# Patient Record
Sex: Male | Born: 1955 | Race: White | Hispanic: No | Marital: Married | Smoking: Former smoker
Health system: Southern US, Community
[De-identification: ages and names within clinical notes are randomized; demographics above are authoritative.]

## PROBLEM LIST (undated history)

## (undated) DIAGNOSIS — T753XXA Motion sickness, initial encounter: Secondary | ICD-10-CM

## (undated) DIAGNOSIS — G4730 Sleep apnea, unspecified: Secondary | ICD-10-CM

## (undated) DIAGNOSIS — C61 Malignant neoplasm of prostate: Secondary | ICD-10-CM

## (undated) HISTORY — DX: Malignant neoplasm of prostate: C61

---
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
## 1994-08-03 HISTORY — PX: APPENDECTOMY: SHX54

## 2004-08-29 ENCOUNTER — Ambulatory Visit: Payer: Self-pay | Admitting: Family Medicine

## 2007-08-04 DIAGNOSIS — C61 Malignant neoplasm of prostate: Secondary | ICD-10-CM

## 2007-08-04 HISTORY — DX: Malignant neoplasm of prostate: C61

## 2007-08-04 HISTORY — PX: TRANSURETHRAL RESECTION OF PROSTATE: SHX73

## 2007-08-04 HISTORY — PX: SHOULDER ADHESION RELEASE: SHX773

## 2007-08-04 HISTORY — PX: TOTAL HIP ARTHROPLASTY: SHX124

## 2009-01-29 ENCOUNTER — Ambulatory Visit: Payer: Self-pay | Admitting: Gastroenterology

## 2009-02-15 ENCOUNTER — Ambulatory Visit: Payer: Self-pay | Admitting: Unknown Physician Specialty

## 2009-08-07 ENCOUNTER — Ambulatory Visit: Payer: Self-pay | Admitting: Specialist

## 2011-08-04 LAB — HM COLONOSCOPY: HM Colonoscopy: NORMAL

## 2012-01-26 DIAGNOSIS — M76899 Other specified enthesopathies of unspecified lower limb, excluding foot: Secondary | ICD-10-CM | POA: Insufficient documentation

## 2012-01-26 DIAGNOSIS — Z8546 Personal history of malignant neoplasm of prostate: Secondary | ICD-10-CM | POA: Insufficient documentation

## 2012-01-26 DIAGNOSIS — Z96649 Presence of unspecified artificial hip joint: Secondary | ICD-10-CM | POA: Insufficient documentation

## 2012-01-26 DIAGNOSIS — M25559 Pain in unspecified hip: Secondary | ICD-10-CM | POA: Insufficient documentation

## 2013-09-20 ENCOUNTER — Ambulatory Visit: Payer: Self-pay | Admitting: Anesthesiology

## 2013-09-20 LAB — CBC WITH DIFFERENTIAL/PLATELET
Basophil #: 0 10*3/uL (ref 0.0–0.1)
Basophil %: 0.7 %
EOS PCT: 2.4 %
Eosinophil #: 0.1 10*3/uL (ref 0.0–0.7)
HCT: 44.2 % (ref 40.0–52.0)
HGB: 14.8 g/dL (ref 13.0–18.0)
LYMPHS PCT: 32.7 %
Lymphocyte #: 1.8 10*3/uL (ref 1.0–3.6)
MCH: 26.9 pg (ref 26.0–34.0)
MCHC: 33.4 g/dL (ref 32.0–36.0)
MCV: 81 fL (ref 80–100)
MONOS PCT: 8.1 %
Monocyte #: 0.4 x10 3/mm (ref 0.2–1.0)
NEUTROS PCT: 56.1 %
Neutrophil #: 3.1 10*3/uL (ref 1.4–6.5)
Platelet: 207 10*3/uL (ref 150–440)
RBC: 5.49 10*6/uL (ref 4.40–5.90)
RDW: 13.7 % (ref 11.5–14.5)
WBC: 5.5 10*3/uL (ref 3.8–10.6)

## 2013-09-20 LAB — BASIC METABOLIC PANEL
ANION GAP: 4 — AB (ref 7–16)
BUN: 18 mg/dL (ref 7–18)
CALCIUM: 8.9 mg/dL (ref 8.5–10.1)
CO2: 26 mmol/L (ref 21–32)
CREATININE: 1.18 mg/dL (ref 0.60–1.30)
Chloride: 108 mmol/L — ABNORMAL HIGH (ref 98–107)
EGFR (African American): >60
Glucose: 109 mg/dL — ABNORMAL HIGH (ref 65–99)
Osmolality: 278 (ref 275–301)
Potassium: 3.9 mmol/L (ref 3.5–5.1)
Sodium: 138 mmol/L (ref 136–145)

## 2013-09-25 ENCOUNTER — Ambulatory Visit: Payer: Self-pay | Admitting: Surgery

## 2014-08-09 LAB — LIPID PANEL
Cholesterol: 222 mg/dL — AB (ref 0–200)
HDL: 46 mg/dL (ref 35–70)
LDL CALC: 150 mg/dL
TRIGLYCERIDES: 128 mg/dL (ref 40–160)

## 2014-08-09 LAB — BASIC METABOLIC PANEL
CREATININE: 1.1 mg/dL (ref 0.6–1.3)
Glucose: 98 mg/dL
Potassium: 4.3 mmol/L (ref 3.4–5.3)
Sodium: 139 mmol/L (ref 137–147)

## 2014-08-09 LAB — HEPATIC FUNCTION PANEL
ALT: 19 U/L (ref 10–40)
AST: 20 U/L (ref 14–40)

## 2014-08-09 LAB — PSA: PSA: <0.1

## 2014-08-09 LAB — HEMOGLOBIN A1C: HEMOGLOBIN A1C: 5.3 % (ref 4.0–6.0)

## 2014-10-18 ENCOUNTER — Ambulatory Visit: Payer: Self-pay | Admitting: Gastroenterology

## 2014-10-30 ENCOUNTER — Ambulatory Visit: Payer: Self-pay | Admitting: Specialist

## 2014-11-24 NOTE — Op Note (Signed)
PATIENT NAME:  Brendan Day, ANTOLIN MR#:  [id number] DATE OF BIRTH:  04-15-1956  DATE OF PROCEDURE:  09/25/2013  PREOPERATIVE DIAGNOSIS: Symptomatic ventral incisional hernia.   POSTOPERATIVE DIAGNOSIS:  Symptomatic ventral incisional hernia.   PROCEDURE PERFORMED: Robotically assisted laparoscopic ventral herniorrhaphy with mesh and 11 cm oval Bard.     SURGEON: Sherri Rad, M.D.   ASSISTANT: None.   ANESTHESIA: General endotracheal and local.   FINDINGS: There was a 2 x 4 cm fascial defect with small fascial bridge involving the area of the previous laparoscopic trocar site above the umbilicus. It was incarcerated with omentum.   SPECIMENS: None.   ESTIMATED BLOOD LOSS: Minimal.   DESCRIPTION OF PROCEDURE: With informed consent, supine position, general endotracheal anesthesia, Foley catheter was placed under sterile technique, left arm was padded and tucked at his side. The abdomen was widely clipped of hair, prepped and draped with ChloraPrep solution and timeout was observed. A 10 mm Visiport was placed under direct visualization through transversely oriented lateral incision on the left side, 15 cm lateral to the ventral hernia site. This was performed under direct visualization and pneumoperitoneum was established. The abdomen was insufflated to a total of 15 mmHg with CO2 insufflation. Two 8.5 mm da Vinci trocars were then placed in the left lateral extreme abdomen. A 10 mm bladeless trocar was then placed between the camera port in the inferior 8.5 mm port site laterally. The patient was then placed right side down, left side up on the table. The robotic arms was then docked. Instruments were then placed under direct visualization. I then moved to the console.   Omental adhesions to the anterior abdominal wall were taken down with sharp dissection and point cautery. The hernia sac was able to be reduced. Incarcerated preperitoneal fat was then excised. The hernia sac was transected at the  fascial interface with sharp dissection and point cautery. Hernia sac contents were removed and discarded.  The intracorporeal ruler was then placed and the fascial defect measured approximately 2 x 4 cm in greatest dimension and there was an additional more inferiorly located fascial defect with small fascial bridge which was measured to include total measurement.   An 11 cm oval diameter Bard mesh with echo balloon system was introduced via the assistant port. It was then centered on the fascial defect. Prior to placement of the mesh. The fascial defect was closed in a transverse orientation with a number 000 V-lock suture with intraperitoneal pressure reduced to 10 mmHg.  With adequate closure, the mesh was then centered with the balloon mechanism on the fascial closure and defect. The mesh was then held in place with the echo balloon. Total of 3 #0 V-Loc sutures were used to circumferentially whipstitch the mesh in place. At the completion of this, the last V-loc suture was rather short and this was reinforced with a single #0 Ethibond suture.   Inspection of the hernia site repaired appeared to be in good order with no evidence of bleeding. There was no gap between the mesh and the anterior abdominal wall. General laparoscopic evaluation with the robotic instruments removed, demonstrated no evidence of further bleeding or omental injury or bowel injury.   Ports were then removed. Pneumoperitoneum was released. A total of 30 mL of 0.25% plain Marcaine was infiltrated along all skin and fascial incisions prior to closure. Skin edges were reapproximated utilizing interrupted simple and vertical mattress 4-0 nylon sutures. Sterile dressings were applied. The patient's Foley catheter was then removed. The patient  was then subsequently extubated and taken to the recovery room in stable and satisfactory condition by anesthesia services.     ____________________________ Jeannette How Marina Gravel, MD  FACS mab:dp D: 09/26/2013 09:17:42 ET T: 09/26/2013 10:52:00 ET JOB#: [id number]  cc: Elta Guadeloupe A. Marina Gravel, MD, <Dictator> Guadalupe Maple, MD Rilan Eiland Bettina Gavia MD ELECTRONICALLY SIGNED 10/04/2013 10:11

## 2015-03-21 ENCOUNTER — Ambulatory Visit (INDEPENDENT_AMBULATORY_CARE_PROVIDER_SITE_OTHER): Payer: BLUE CROSS/BLUE SHIELD | Admitting: Family Medicine

## 2015-03-21 ENCOUNTER — Encounter: Payer: Self-pay | Admitting: Family Medicine

## 2015-03-21 VITALS — BP 113/73 | HR 71 | Temp 98.0°F | Resp 16 | Ht 68.0 in | Wt 209.0 lb

## 2015-03-21 DIAGNOSIS — S39012A Strain of muscle, fascia and tendon of lower back, initial encounter: Secondary | ICD-10-CM

## 2015-03-21 DIAGNOSIS — S338XXA Sprain of other parts of lumbar spine and pelvis, initial encounter: Secondary | ICD-10-CM | POA: Diagnosis not present

## 2015-03-21 LAB — POCT URINALYSIS DIPSTICK
Bilirubin, UA: NEGATIVE
Blood, UA: NEGATIVE
Glucose, UA: NEGATIVE
Ketones, UA: NEGATIVE
LEUKOCYTES UA: NEGATIVE
Nitrite, UA: NEGATIVE
PH UA: 6
PROTEIN UA: NEGATIVE
SPEC GRAV UA: 1.01
Urobilinogen, UA: 0.2

## 2015-03-21 MED ORDER — BACLOFEN 10 MG PO TABS: 10.0000 mg | ORAL_TABLET | Freq: Three times a day (TID) | ORAL | Status: DC

## 2015-03-21 MED ORDER — NAPROXEN 500 MG PO TABS: 500.0000 mg | ORAL_TABLET | Freq: Two times a day (BID) | ORAL | Status: DC

## 2015-03-21 NOTE — Patient Instructions (Signed)
Maintaining an ideal body habitus, regular exercise, proper lifting techniques and mindfulness of exacerbating factors will be useful in long term management.  Instructed on use of heating pad with exercises. Consider concomitant therapy with PT, massage therapist or chiropractor. May use anti-inflammatory medication and muscle relaxer as needed.  If you develop progressive numbness, weakness, loss of feeling in your pelvis, or loss of bowel/bladder control please seek immediate medical attention.

## 2015-03-21 NOTE — Progress Notes (Signed)
Subjective:    Patient ID: Brendan Day, male    DOB: 07/03/1956, 59 y.o.   MRN: [id number]  HPI: Brendan Day is a 59 y.o. male presenting on 03/21/2015 for Back Pain   Pt presents for back pain. 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 Pain started in the evening. He has been off since Sunday- tried ice, heat, rest.    Back Pain This is a new problem. The current episode started in the past 7 days. The pain is present in the lumbar spine. The pain does not radiate. The pain is at a severity of 5/10. The pain is moderate. The pain is worse during the day. The symptoms are aggravated by sitting. Pertinent negatives include no bladder incontinence, bowel incontinence, chest pain, fever, numbness, paresthesias, pelvic pain, perianal numbness, tingling or weakness. Risk factors include history of cancer. He has tried ice, NSAIDs, heat and bed rest for the symptoms. The treatment provided mild relief.      Past Medical History  Diagnosis Date  . Prostate cancer 2009   Past Surgical History  Procedure Laterality Date  . Transurethral resection of prostate  2009  . Total hip arthroplasty  2009  . Tonsillectomy  1978  . Shoulder adhesion release  2009  . Appendectomy  1996   Social History   Social History  . Marital Status: Married    Spouse Name: N/A  . Number of Children: N/A  . Years of Education: N/A   Occupational History  . Not on file.   Social History Main Topics  . Smoking status: Former Research scientist (life sciences)  . Smokeless tobacco: Never Used  . Alcohol Use: No  . Drug Use: No  . Sexual Activity: Not on file   Other Topics Concern  . Not on file   Social History Narrative  . No narrative on file    No current outpatient prescriptions on file prior to visit.   No current facility-administered medications on file prior to visit.    Review of Systems  Constitutional: Negative for fever and chills.  Respiratory:  Negative for chest tightness, shortness of breath and wheezing.   Cardiovascular: Negative for chest pain, palpitations and leg swelling.  Gastrointestinal: Negative for bowel incontinence.  Genitourinary: Negative for bladder incontinence, urgency, flank pain and pelvic pain.  Musculoskeletal: Positive for back pain. Negative for gait problem, neck pain and neck stiffness.  Neurological: Negative for dizziness, tingling, weakness, numbness and paresthesias.   Per HPI unless specifically indicated above     Objective:    BP 113/73 mmHg  Pulse 71  Temp(Src) 98 F (36.7 C) (Oral)  Resp 16  Ht 5\' 8"  (1.727 m)  Wt 209 lb (94.802 kg)  BMI 31.79 kg/m2  Wt Readings from Last 3 Encounters:  03/21/15 209 lb (94.802 kg)    Physical Exam  Constitutional: He is oriented to person, place, and time. He appears well-developed and well-nourished. No distress.  Cardiovascular: Normal rate and regular rhythm.  Exam reveals no gallop and no friction rub.   No murmur heard. Pulmonary/Chest: Effort normal and breath sounds normal. He has no wheezes. He exhibits no tenderness.  Musculoskeletal:       Lumbar back: He exhibits decreased range of motion (due to pain: Unable to twist fully to the L) and tenderness (focal tenderness L lower lumbar spine. No radiation. ).  Negative straight leg test.   Neurological: He is alert and oriented  to person, place, and time. He has normal strength and normal reflexes. No cranial nerve deficit or sensory deficit.  Skin: He is not diaphoretic.   Results for orders placed or performed in visit on 81/44/81  Basic metabolic panel  Result Value Ref Range   Glucose 98 mg/dL   Creatinine 1.1 0.6 - 1.3 mg/dL   Potassium 4.3 3.4 - 5.3 mmol/L   Sodium 139 137 - 147 mmol/L  Lipid panel  Result Value Ref Range   Triglycerides 128 40 - 160 mg/dL   Cholesterol 222 (A) 0 - 200 mg/dL   HDL 46 35 - 70 mg/dL   LDL Cholesterol 150 mg/dL  Hepatic function panel  Result  Value Ref Range   ALT 19 10 - 40 U/L   AST 20 14 - 40 U/L  Hemoglobin A1c  Result Value Ref Range   Hgb A1c MFr Bld 5.3 4.0 - 6.0 %  PSA  Result Value Ref Range   PSA <0.1   POCT Urinalysis Dipstick  Result Value Ref Range   Color, UA yellow    Clarity, UA clear    Glucose, UA neg    Bilirubin, UA neg    Ketones, UA neg    Spec Grav, UA 1.010    Blood, UA neg    pH, UA 6.0    Protein, UA neg    Urobilinogen, UA 0.2    Nitrite, UA neg    Leukocytes, UA Negative Negative  HM COLONOSCOPY  Result Value Ref Range   HM Colonoscopy normal       Assessment & Plan:   Problem List Items Addressed This Visit    None    Visit Diagnoses    Lumbosacral strain, initial encounter    -  Primary    Lumbosacral strain. Urine negative.  NSAIDs and muscle relaxant PRN. Supportive care at home. Back pain alarm symptoms reviewed.     Relevant Medications    baclofen (LIORESAL) 10 MG tablet    naproxen (NAPROSYN) 500 MG tablet    Other Relevant Orders    POCT Urinalysis Dipstick (Completed)       Meds ordered this encounter  Medications  . sildenafil (REVATIO) 20 MG tablet    Sig: Take 20 mg by mouth as needed.  . Omega-3 Fatty Acids (FISH OIL) 1000 MG CAPS    Sig: Take 1,000 mg by mouth daily.  . Glucosamine 750 MG TABS    Sig: Take 1 tablet by mouth daily.  . baclofen (LIORESAL) 10 MG tablet    Sig: Take 1 tablet (10 mg total) by mouth 3 (three) times daily.    Dispense:  30 each    Refill:  0    Order Specific Question:  Supervising Provider    Answer:  Arlis Porta [phone]  . naproxen (NAPROSYN) 500 MG tablet    Sig: Take 1 tablet (500 mg total) by mouth 2 (two) times daily with a meal.    Dispense:  30 tablet    Refill:  0    Order Specific Question:  Supervising Provider    Answer:  Arlis Porta [[id number]]      Follow up plan: Return if symptoms worsen or fail to improve.

## 2016-08-10 ENCOUNTER — Encounter: Payer: Self-pay | Admitting: Family Medicine

## 2016-08-10 ENCOUNTER — Ambulatory Visit (INDEPENDENT_AMBULATORY_CARE_PROVIDER_SITE_OTHER): Payer: BLUE CROSS/BLUE SHIELD | Admitting: Family Medicine

## 2016-08-10 ENCOUNTER — Encounter: Payer: Self-pay | Admitting: *Deleted

## 2016-08-10 VITALS — BP 119/78 | HR 78 | Temp 98.4°F | Wt 209.0 lb

## 2016-08-10 DIAGNOSIS — M62830 Muscle spasm of back: Secondary | ICD-10-CM | POA: Diagnosis not present

## 2016-08-10 DIAGNOSIS — R05 Cough: Secondary | ICD-10-CM

## 2016-08-10 DIAGNOSIS — M545 Low back pain, unspecified: Secondary | ICD-10-CM

## 2016-08-10 DIAGNOSIS — R059 Cough, unspecified: Secondary | ICD-10-CM

## 2016-08-10 MED ORDER — NAPROXEN 500 MG PO TABS: 500.0000 mg | ORAL_TABLET | Freq: Two times a day (BID) | ORAL | 1 refills | Status: AC

## 2016-08-10 MED ORDER — BACLOFEN 10 MG PO TABS: 5.0000 mg | ORAL_TABLET | Freq: Three times a day (TID) | ORAL | 1 refills | Status: DC | PRN

## 2016-08-10 MED ORDER — BENZONATATE 100 MG PO CAPS: 100.0000 mg | ORAL_CAPSULE | Freq: Three times a day (TID) | ORAL | 1 refills | Status: AC | PRN

## 2016-08-10 NOTE — Progress Notes (Signed)
Subjective:    Patient ID: Brendan Day, male    DOB: [birth year], 61 y.o.   MRN: [id number]  Brendan Day is a 61 y.o. male presenting on 08/10/2016 for Cough (for 3 days and now has pulled muscle in back from coughing)  Patient presents for a same day appointment.  HPI  LOW BACK PAIN, Acute Back Spasm (without chronic back pain) - Reports symptoms started last night while coughing and had an acute inciting injury with lower midline back pain. Some improvement today after treatment. Describes pain as deeper knot severe 8-9/10 intermittently when worsening, otherwise he did not have any significant pain at rest if not moving, but worse with prolonged sitting or standing still will gradually get worsening pain. No pain radiating to legs. Missed work today due to pain. - Tried Tramadol (old rx previously rx for prior back pain or hip surgery, thinks it is expired) 50mg  x 1 with good improvement, Ibuprofen 800mg  x1 last night and this AM, no Tylenol - Tried heating pad and back support with relief - No prior history of known lumbar OA/DJD, no prior back surgery - Prior similar back pain flares improved with medications and rest - Admits improved coughing for past 3 days, had recent sick contact with similar symptoms, also with had initially hot / cold flashes (had resolved) - Denies any fevers/chills, numbness, tingling, weakness, loss of control bladder/bowel incontinence or retention, unintentional wt loss, night sweats   Social History  Substance Use Topics  . Smoking status: Former Research scientist (life sciences)  . Smokeless tobacco: Never Used  . Alcohol use No    Review of Systems Per HPI unless specifically indicated above     Objective:    BP 119/78 (BP Location: Left Arm, Patient Position: Sitting, Cuff Size: Large)   Pulse 78   Temp 98.4 F (36.9 C) (Oral)   Wt 209 lb (94.8 kg)   SpO2 99%   BMI 31.78 kg/m   Wt Readings from Last 3 Encounters:  08/10/16 209 lb (94.8 kg)  03/21/15 209 lb  (94.8 kg)    Physical Exam  Constitutional: He appears well-developed and well-nourished. No distress.  Well-appearing, slightly uncomfortable with low back stiffness, cooperative  HENT:  Mouth/Throat: Oropharynx is clear and moist.  Cardiovascular: Normal rate and intact distal pulses.   Pulmonary/Chest: Effort normal.  Musculoskeletal: He exhibits no edema.  Low Back Inspection: Normal appearance, no spinal deformity, symmetrical. Palpation: No tenderness over spinous processes. Bilateral lower lumbar paraspinal muscles mild tender to touch with hypertonicity and spasm ROM: Improved forward flexion active ROM and back extension without discomfort Special Testing: Seated SLR negative for radicular pain bilaterally Strength: Bilateral hip flex/ext 5/5, knee flex/ext 5/5, ankle dorsiflex/plantarflex 5/5 Neurovascular: intact distal sensation to light touch  Neurological: He is alert.  Skin: Skin is warm and dry. No rash noted. He is not diaphoretic. No erythema.  Psychiatric: His behavior is normal.  Nursing note and vitals reviewed.    I have personally reviewed the following lab results from 03/2015.  Results for orders placed or performed in visit on [id number]  Basic metabolic panel  Result Value Ref Range   Glucose 98 mg/dL   Creatinine 1.1 0.6 - 1.3 mg/dL   Potassium 4.3 3.4 - 5.3 mmol/L   Sodium 139 137 - 147 mmol/L  Lipid panel  Result Value Ref Range   Triglycerides 128 40 - 160 mg/dL   Cholesterol 222 (A) 0 - 200 mg/dL   HDL 46 35 - 70 mg/dL  LDL Cholesterol 150 mg/dL  Hepatic function panel  Result Value Ref Range   ALT 19 10 - 40 U/L   AST 20 14 - 40 U/L  Hemoglobin A1c  Result Value Ref Range   Hgb A1c MFr Bld 5.3 4.0 - 6.0 %  PSA  Result Value Ref Range   PSA <0.1   POCT Urinalysis Dipstick  Result Value Ref Range   Color, UA yellow    Clarity, UA clear    Glucose, UA neg    Bilirubin, UA neg    Ketones, UA neg    Spec Grav, UA 1.010    Blood, UA neg     pH, UA 6.0    Protein, UA neg    Urobilinogen, UA 0.2    Nitrite, UA neg    Leukocytes, UA Negative Negative  HM COLONOSCOPY  Result Value Ref Range   HM Colonoscopy normal       Assessment & Plan:   Problem List Items Addressed This Visit    Acute midline low back pain without sciatica - Primary    Acute on chronic (only infrequent prior history intermittent back flares) bilateral LBP without associated sciatica. Suspect likely due to muscle spasm/strain, with suspected injury with persistent coughing, violent coughing spell, no trauma. No known DJD or prior back problem. - No red flag symptoms. Negative SLR for radiculopathy - Improving with conservative therapy  Plan: 1. Start anti-inflammatory trial with rx Naprosyn 500mg  BID wc x 2-4 weeks, then PRN - note discussion with patient about not indication for Tramadol for his current symptoms, he can use old rx but do not plan to treat back spasm with tramadol / opiates, reviewed risks of these medications 2. Start muscle relaxant with Baclofen 10mg  tabs - take 5-10mg  up to TID PRN, titrate up as tolerated 3. May use Tylenol PRN for breakthrough 4. Encouraged continued use of heating pad 1-2x daily for now then PRN 5.  Follow-up 4-6 weeks PRN if not improved for re-evaluation. If not improved consider X-ray imaging if >6 weeks. Consider trial of PT for strengthening. Also can consider prednisone burst if significant worsening or sciatica symptoms.       Relevant Medications   baclofen (LIORESAL) 10 MG tablet   naproxen (NAPROSYN) 500 MG tablet    Other Visit Diagnoses    Muscle spasm of back    - See above A&P LBP    Relevant Medications   baclofen (LIORESAL) 10 MG tablet   naproxen (NAPROSYN) 500 MG tablet   Cough     - Mostly resolved, but provide rx Benzonatate, he has been on this before with improvement, goal to avoid cough and reinjury back   Relevant Medications   benzonatate (TESSALON) 100 MG capsule      Meds  ordered this encounter  Medications  . benzonatate (TESSALON) 100 MG capsule    Sig: Take 1 capsule (100 mg total) by mouth 3 (three) times daily as needed for cough.    Dispense:  30 capsule    Refill:  1  . baclofen (LIORESAL) 10 MG tablet    Sig: Take 0.5-1 tablets (5-10 mg total) by mouth 3 (three) times daily as needed for muscle spasms.    Dispense:  30 each    Refill:  1  . naproxen (NAPROSYN) 500 MG tablet    Sig: Take 1 tablet (500 mg total) by mouth 2 (two) times daily with a meal. For 2 weeks, then as needed  Dispense:  60 tablet    Refill:  1      Follow up plan: Return in about 4 weeks (around 09/07/2016) for low back pain.  Brendan Day, [facility] 08/10/2016, 11:19 PM

## 2016-08-10 NOTE — Assessment & Plan Note (Signed)
Acute on chronic (only infrequent prior history intermittent back flares) bilateral LBP without associated sciatica. Suspect likely due to muscle spasm/strain, with suspected injury with persistent coughing, violent coughing spell, no trauma. No known DJD or prior back problem. - No red flag symptoms. Negative SLR for radiculopathy - Improving with conservative therapy  Plan: 1. Start anti-inflammatory trial with rx Naprosyn 500mg  BID wc x 2-4 weeks, then PRN - note discussion with patient about not indication for Tramadol for his current symptoms, he can use old rx but do not plan to treat back spasm with tramadol / opiates, reviewed risks of these medications 2. Start muscle relaxant with Baclofen 10mg  tabs - take 5-10mg  up to TID PRN, titrate up as tolerated 3. May use Tylenol PRN for breakthrough 4. Encouraged continued use of heating pad 1-2x daily for now then PRN 5.  Follow-up 4-6 weeks PRN if not improved for re-evaluation. If not improved consider X-ray imaging if >6 weeks. Consider trial of PT for strengthening. Also can consider prednisone burst if significant worsening or sciatica symptoms.

## 2016-08-10 NOTE — Patient Instructions (Signed)
Thank you for coming in to clinic today.  1. For your Back Pain - I think that this is due to Muscle Spasms or strain.  2. Start with anti-inflammatory Naprosyn (Naproxen) 500mg  twice daily (12 hrs apart, with food, breakfast and dinner) every day for next 2 weeks then as needed up to 4 weeks if helping, then can use only as needed 3. Start Baclofen (Lioresal) 10mg  tablets - cut in half for 5mg  at night for muscle relaxant - may make you sedated or sleepy (be careful driving or working on this) if tolerated you can take every 8 hours, half or whole tab 4. May use Tylenol Extra Str 500mg  tabs - may take 1-2 tablets every 6 hours or 3 times daily as needed (max is 6 per day for 3000mg ) 5. Recommend to continue using heating pad on your lower back 1-2x daily for few weeks  This pain may take weeks to months to fully resolve, but hopefully it will respond to the medicine initially. All back injuries (small or serious) are slow to heal since we use our back muscles every day. Be careful with turning, twisting, lifting, sitting / standing for prolonged periods, and avoid re-injury.  If your symptoms significantly worsen with more pain, or new symptoms with weakness in one or both legs, new or different shooting leg pains, numbness in legs or groin, loss of control or retention of urine or bowel movements, please call back for advice and you may need to go directly to the Emergency Department.  Please schedule a follow-up appointment with Dr. Parks Ranger in 4-6 weeks follow-up for low back pain if not improved  If you have any other questions or concerns, please feel free to call the clinic or send a message through [place]. You may also schedule an earlier appointment if necessary.  Nobie Putnam, DO [place]

## 2016-08-16 IMAGING — CT CT CHEST W/O CM
2 of 3 series · 15 of 36 positions shown, 18 images · non-contrast
Comparison: [REDACTED] x-ray 10/23/2014, 10/25/2014

CLINICAL DATA: Pulmonary nodule, cough for 1 month. History of
prostate cancer and skin cancer.

EXAM:
CT CHEST WITHOUT CONTRAST
TECHNIQUE: Multidetector CT imaging of the chest was performed following the
standard protocol without IV contrast..

[Series 2: routine chest wo · axial · 0.68mm/px · z∈[+86,+336]mm · 12 of 60 slices shown, 15 images]
[im 5/60  mediastinal]
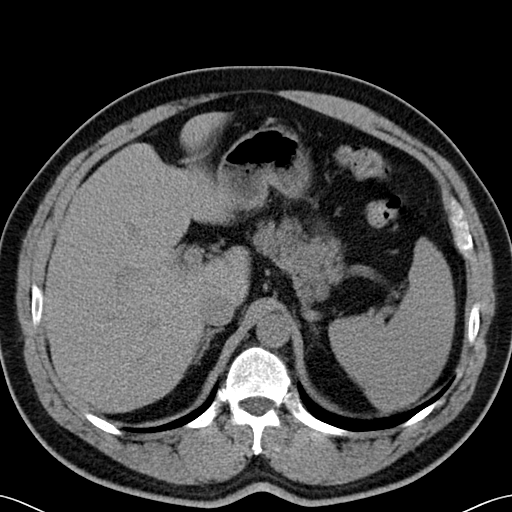
[im 5/60  lung]
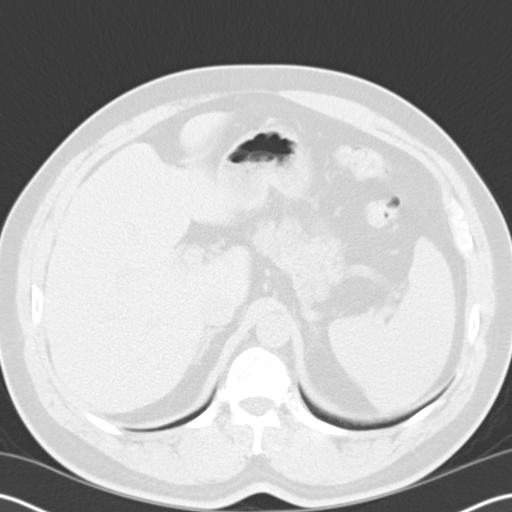
[im 9/60  lung]
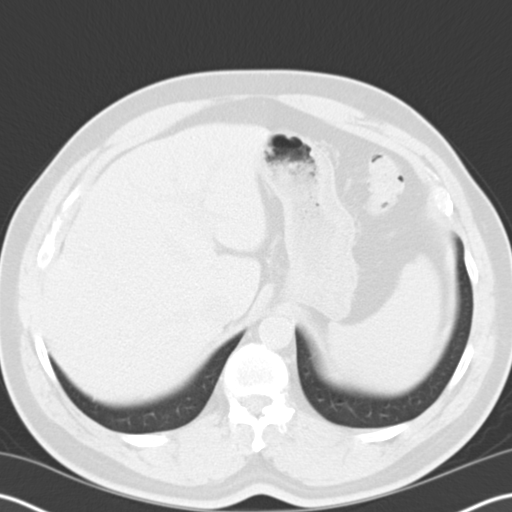
[im 14/60  lung]
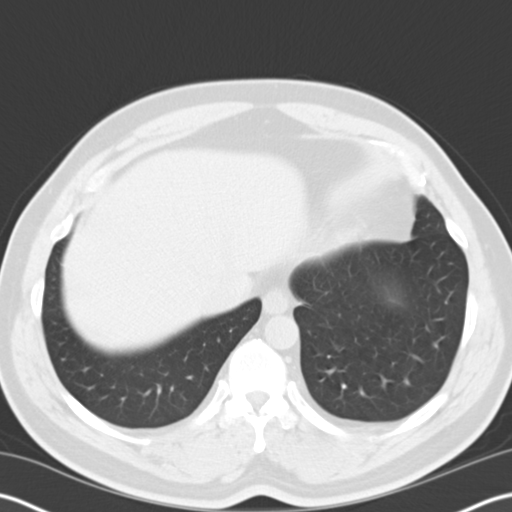
[im 18/60  lung]
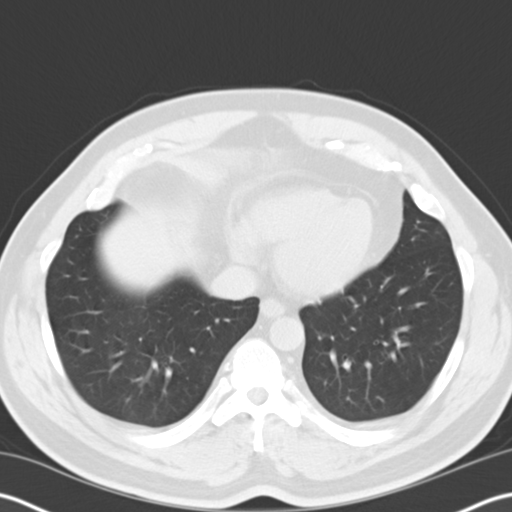
[im 22/60  mediastinal]
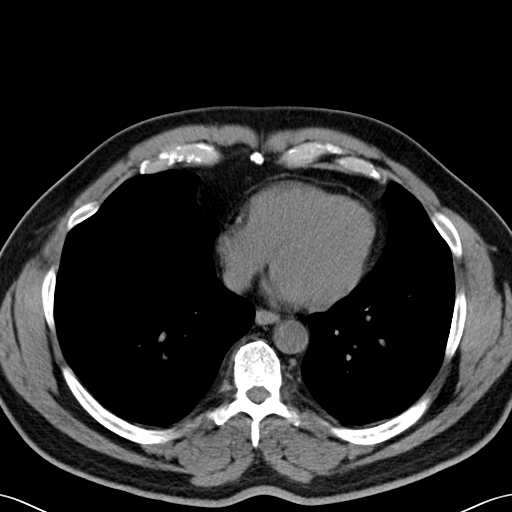
[im 22/60  lung]
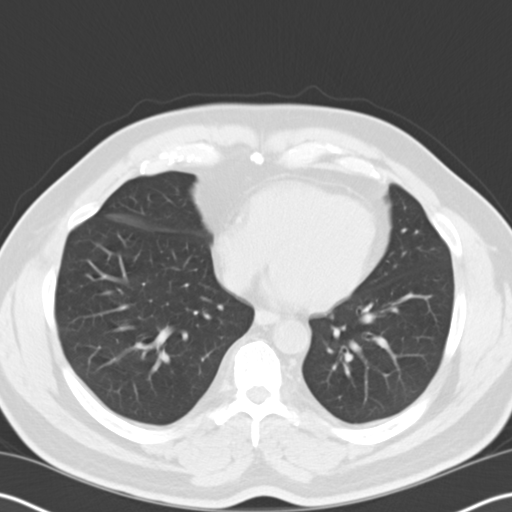
[im 27/60  lung]
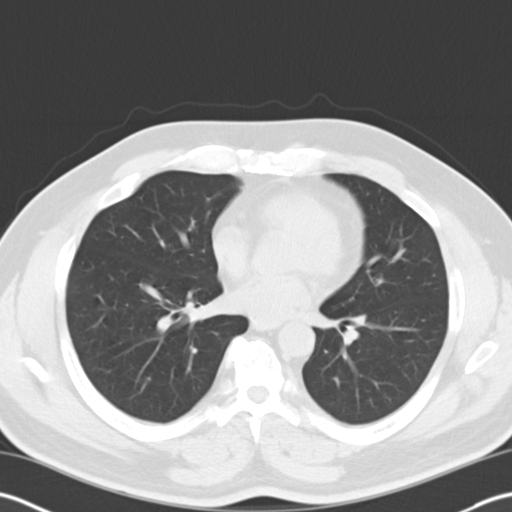
[im 33/60  lung]
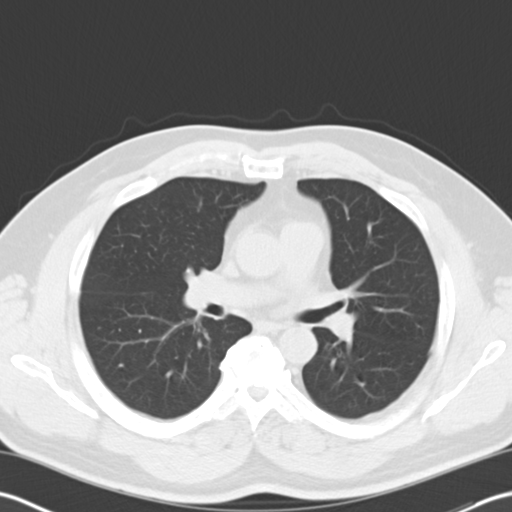
[im 38/60  lung]
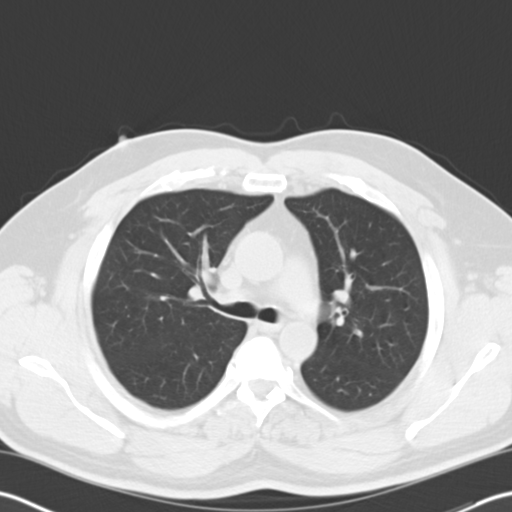
[im 42/60  mediastinal]
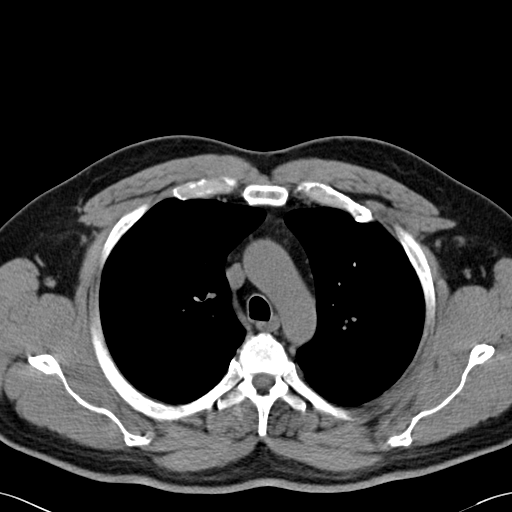
[im 42/60  lung]
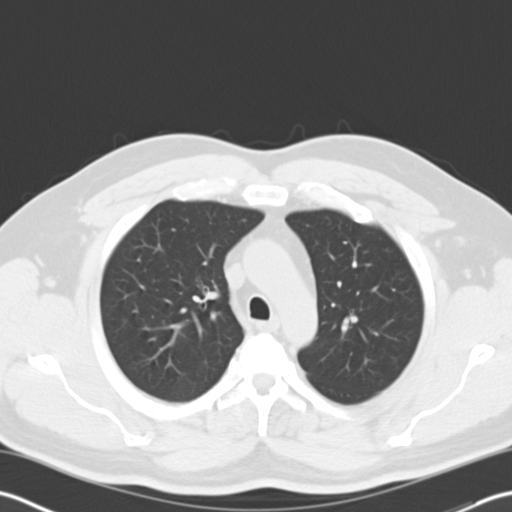
[im 46/60  lung]
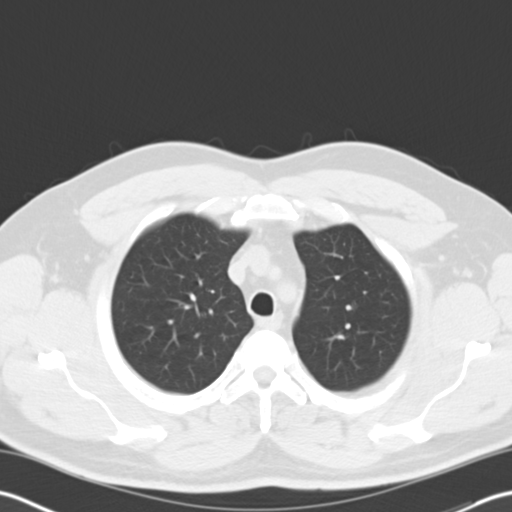
[im 51/60  lung]
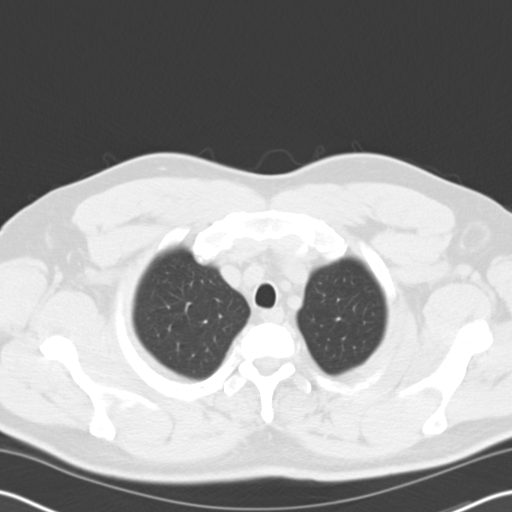
[im 55/60  lung]
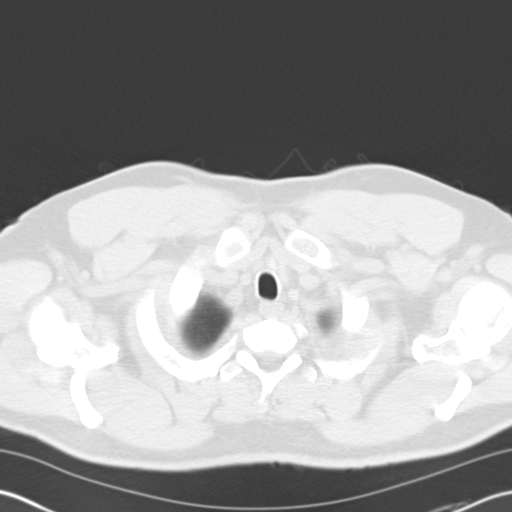

[Series 5: cor routine chest wo · coronal · 0.58mm/px · 3 of 129 slices shown]
[im 26/129  lung]
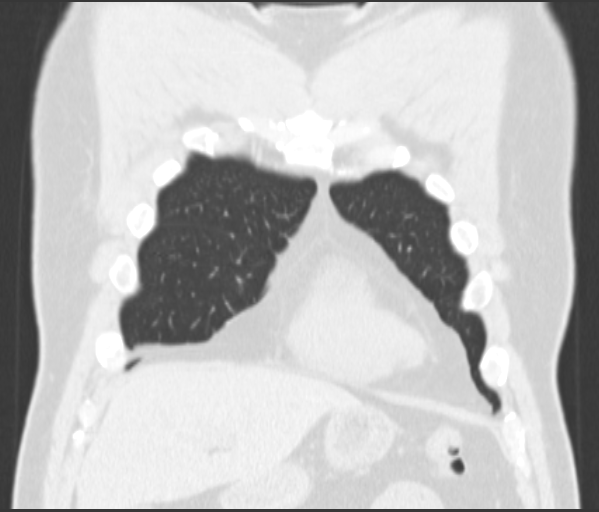
[im 52/129  lung]
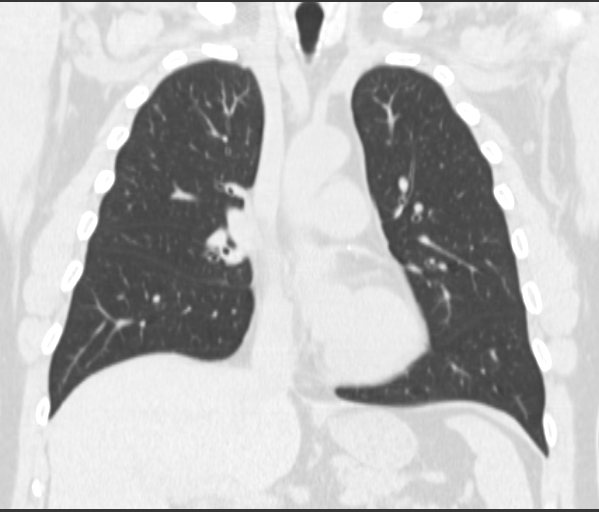
[im 77/129  lung]
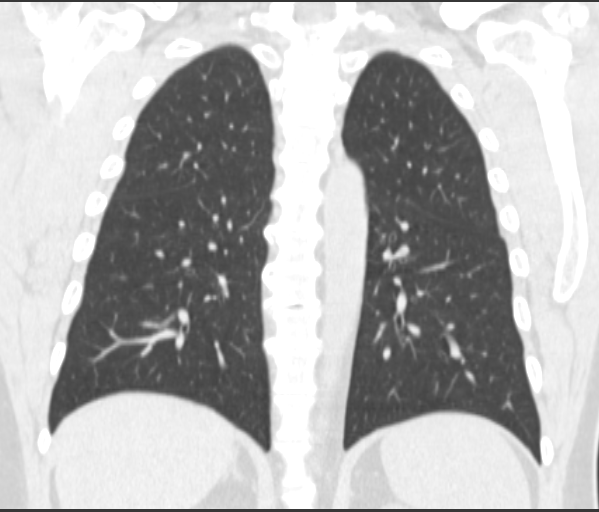

[15 of 36 positions shown; findings below may reference images not displayed]

FINDINGS: The central airways are patent. The lungs are clear. No pulmonary
nodule or pulmonary mass. There is no pleural effusion or
pneumothorax.

There are no pathologically enlarged axillary, hilar or mediastinal
lymph nodes.

The heart size is normal. There is no pericardial effusion. The
thoracic aorta is normal in caliber.

Review of bone windows demonstrates no focal lytic or sclerotic
lesions. Ossification of the posterior longitudinal ligament at
T6-7. Mild degenerative disc disease throughout the thoracic spine.

Limited non-contrast images of the upper abdomen were obtained. The
adrenal glands appear normal. The remainder of the visualized
abdominal organs are unremarkable.
IMPRESSION: 1. No active cardiopulmonary disease.
2. No pulmonary nodule or pulmonary mass.

## 2017-10-13 ENCOUNTER — Other Ambulatory Visit: Payer: Self-pay | Admitting: General Surgery

## 2017-10-13 DIAGNOSIS — R109 Unspecified abdominal pain: Secondary | ICD-10-CM

## 2017-10-18 ENCOUNTER — Ambulatory Visit
Admission: RE | Admit: 2017-10-18 | Discharge: 2017-10-18 | Disposition: A | Discharge status: Home / Self Care | Payer: BLUE CROSS/BLUE SHIELD | Source: Ambulatory Visit | Attending: General Surgery | Admitting: General Surgery

## 2017-10-18 DIAGNOSIS — R109 Unspecified abdominal pain: Secondary | ICD-10-CM | POA: Insufficient documentation

## 2017-10-18 MED ORDER — IOPAMIDOL (ISOVUE-300) INJECTION 61%
100.0000 mL | Freq: Once | INTRAVENOUS | Status: AC | PRN
  Administered 2017-10-18: 100 mL via INTRAVENOUS

## 2017-10-19 ENCOUNTER — Ambulatory Visit: Payer: Self-pay

## 2018-10-26 ENCOUNTER — Other Ambulatory Visit: Payer: Self-pay | Admitting: Family Medicine

## 2018-10-26 ENCOUNTER — Other Ambulatory Visit (HOSPITAL_COMMUNITY): Payer: Self-pay | Admitting: Family Medicine

## 2018-10-26 DIAGNOSIS — R1032 Left lower quadrant pain: Secondary | ICD-10-CM

## 2018-10-26 DIAGNOSIS — R1909 Other intra-abdominal and pelvic swelling, mass and lump: Secondary | ICD-10-CM

## 2018-11-01 ENCOUNTER — Other Ambulatory Visit: Payer: Self-pay

## 2018-11-01 ENCOUNTER — Ambulatory Visit
Admission: RE | Admit: 2018-11-01 | Discharge: 2018-11-01 | Disposition: A | Discharge status: Home / Self Care | Payer: BLUE CROSS/BLUE SHIELD | Source: Ambulatory Visit | Attending: Family Medicine | Admitting: Family Medicine

## 2018-11-01 DIAGNOSIS — R1909 Other intra-abdominal and pelvic swelling, mass and lump: Secondary | ICD-10-CM | POA: Insufficient documentation

## 2018-11-01 DIAGNOSIS — R1032 Left lower quadrant pain: Secondary | ICD-10-CM | POA: Insufficient documentation

## 2018-12-01 ENCOUNTER — Telehealth: Payer: Self-pay | Admitting: Gastroenterology

## 2018-12-01 NOTE — Telephone Encounter (Signed)
Patient called & l/m on machine he was returning call to schedule a colonoscopy.Brendan Day

## 2018-12-05 NOTE — Telephone Encounter (Signed)
Notified pt his colonoscopy is due 10/2019.  Thanks [employer]

## 2018-12-15 ENCOUNTER — Other Ambulatory Visit: Payer: Self-pay | Admitting: Specialist

## 2018-12-15 ENCOUNTER — Ambulatory Visit
Admission: RE | Admit: 2018-12-15 | Discharge: 2018-12-15 | Disposition: A | Discharge status: Home / Self Care | Payer: BLUE CROSS/BLUE SHIELD | Source: Ambulatory Visit | Attending: Specialist | Admitting: Specialist

## 2018-12-15 ENCOUNTER — Other Ambulatory Visit: Payer: Self-pay

## 2018-12-15 DIAGNOSIS — R600 Localized edema: Secondary | ICD-10-CM | POA: Insufficient documentation

## 2019-01-18 ENCOUNTER — Encounter: Payer: Self-pay | Admitting: *Deleted

## 2019-06-04 DIAGNOSIS — U071 COVID-19: Secondary | ICD-10-CM

## 2019-06-04 HISTORY — DX: COVID-19: U07.1

## 2019-06-23 ENCOUNTER — Other Ambulatory Visit: Payer: Self-pay

## 2019-06-23 DIAGNOSIS — Z20822 Contact with and (suspected) exposure to covid-19: Secondary | ICD-10-CM

## 2019-06-25 LAB — NOVEL CORONAVIRUS, NAA: SARS-CoV-2, NAA: DETECTED — AB

## 2019-08-09 ENCOUNTER — Telehealth: Payer: Self-pay

## 2019-08-09 ENCOUNTER — Other Ambulatory Visit: Payer: Self-pay

## 2019-08-09 ENCOUNTER — Telehealth: Payer: Self-pay | Admitting: Gastroenterology

## 2019-08-09 DIAGNOSIS — Z1211 Encounter for screening for malignant neoplasm of colon: Secondary | ICD-10-CM

## 2019-08-09 NOTE — Telephone Encounter (Signed)
This pt was returning your call.

## 2019-08-09 NOTE — Telephone Encounter (Signed)
Returned patients call to schedule his colonoscopy.  He was driving at the time and asked to call me back.  Thanks [employer]

## 2019-08-09 NOTE — Telephone Encounter (Signed)
Patient called stating someone called him yesterday around 4:00pm. He would like to schedule a colonoscopy with Dr Wohl.(returned patient/IN [place])

## 2019-08-09 NOTE — Telephone Encounter (Signed)
Gastroenterology Pre-Procedure Review  Request Date: April 12th, 2021 Requesting Physician: Dr. Allen Norris  PATIENT REVIEW QUESTIONS: The patient responded to the following health history questions as indicated:    1. Are you having any GI issues? no 2. Do you have a personal history of Polyps? yes (10/2014 polyps noted) 3. Do you have a family history of Colon Cancer or Polyps? unsure 4. Diabetes Mellitus? no 5. Joint replacements in the past 12 months?no 6. Major health problems in the past 3 months?no 7. Any artificial heart valves, MVP, or defibrillator?no    MEDICATIONS & ALLERGIES:    Patient reports the following regarding taking any anticoagulation/antiplatelet therapy:   Plavix, Coumadin, Eliquis, Xarelto, Lovenox, Pradaxa, Brilinta, or Effient? no Aspirin? no  Patient confirms/reports the following medications:  Current Outpatient Medications  Medication Sig Dispense Refill  . baclofen (LIORESAL) 10 MG tablet Take 0.5-1 tablets (5-10 mg total) by mouth 3 (three) times daily as needed for muscle spasms. 30 each 1  . benzonatate (TESSALON) 100 MG capsule Take 1 capsule (100 mg total) by mouth 3 (three) times daily as needed for cough. 30 capsule 1  . Glucosamine 750 MG TABS Take 1 tablet by mouth daily.    . naproxen (NAPROSYN) 500 MG tablet Take 1 tablet (500 mg total) by mouth 2 (two) times daily with a meal. For 2 weeks, then as needed 60 tablet 1  . Omega-3 Fatty Acids (FISH OIL) 1000 MG CAPS Take 1,000 mg by mouth daily.    . sildenafil (REVATIO) 20 MG tablet Take 20 mg by mouth as needed.     No current facility-administered medications for this visit.    Patient confirms/reports the following allergies:  Allergies  Allergen Reactions  . Iodinated Diagnostic Agents Rash    No orders of the defined types were placed in this encounter.   AUTHORIZATION INFORMATION Primary Insurance: 1D#: Group #:  Secondary Insurance: 1D#: Group #:  SCHEDULE INFORMATION: Date:  April 12th, 2021 Time: Location:[facility]

## 2019-08-15 ENCOUNTER — Encounter: Payer: Self-pay | Admitting: Gastroenterology

## 2019-10-21 IMAGING — US VENOUS DOPPLER ULTRASOUND OF  LOWER EXTREMITIES
1 series · 13 of 24 positions shown · non-contrast
Comparison: None.

CLINICAL DATA: Bilateral lower extremity edema



[Series 1: venous doppler ultrasound of lower extremities · 0.08mm/px · 13 of 59 slices shown]
[im 1/59]
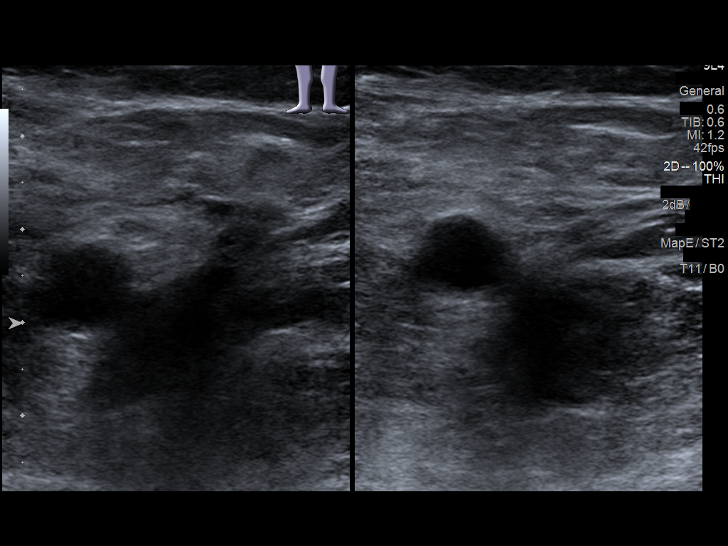
[im 6/59]
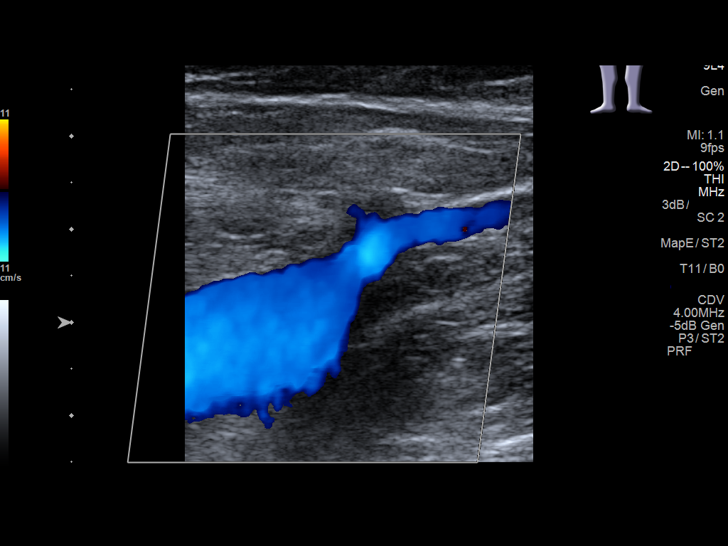
[im 11/59]
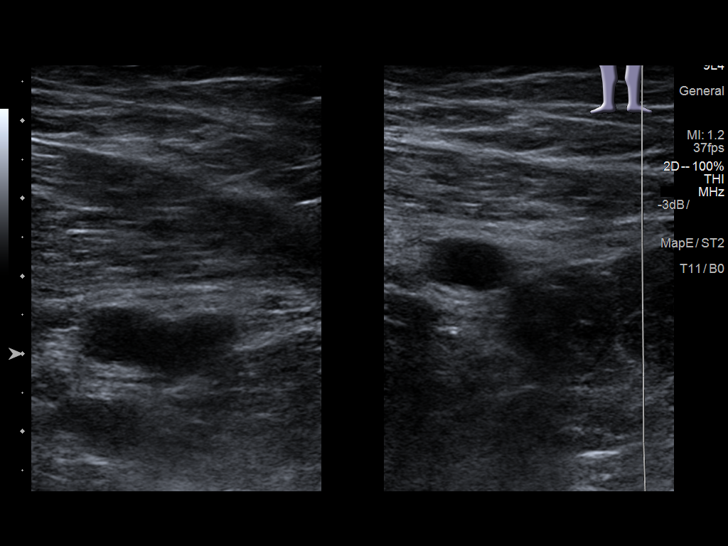
[im 16/59]
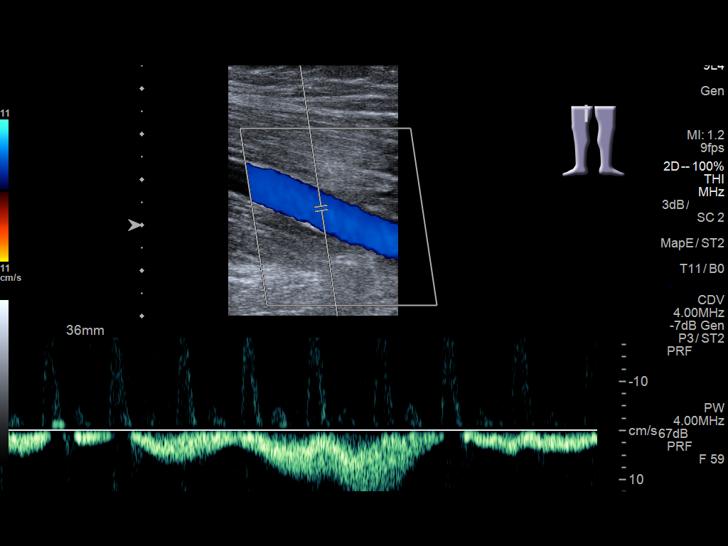
[im 21/59]
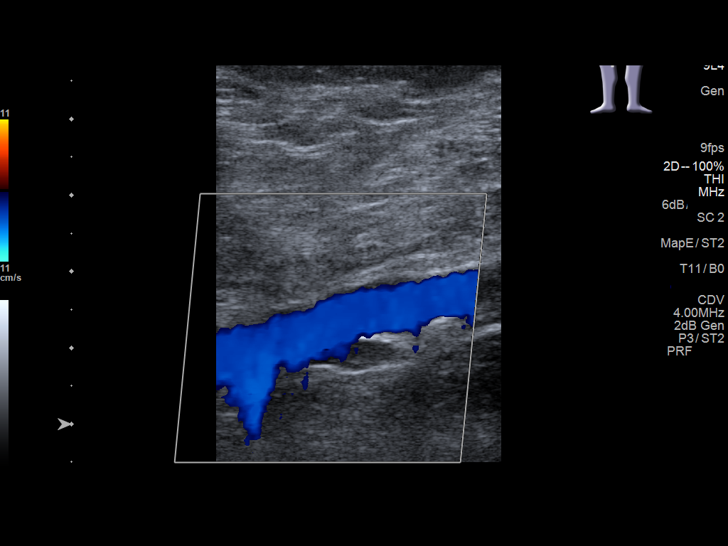
[im 26/59]
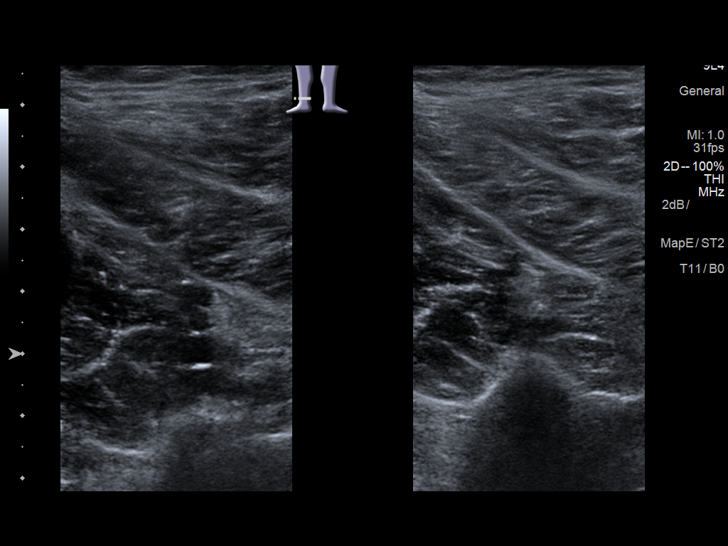
[im 31/59]
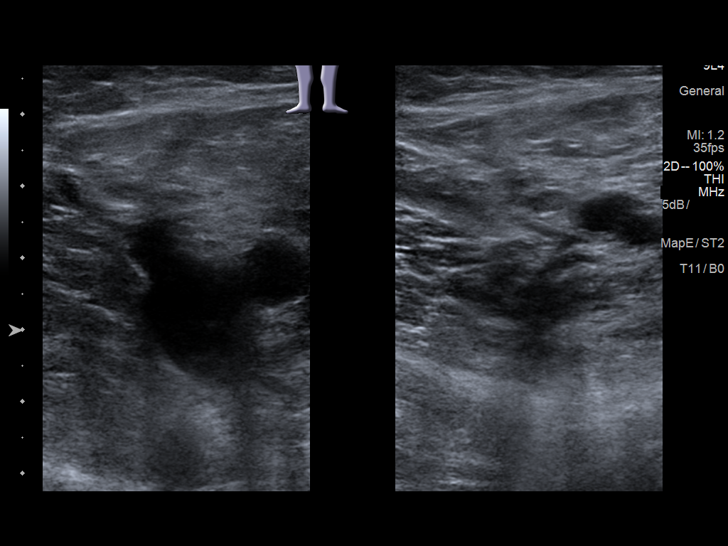
[im 33/59]
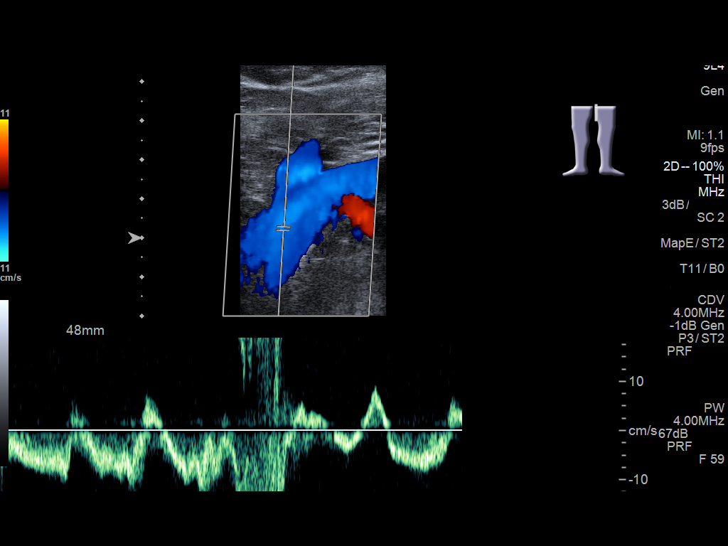
[im 38/59]
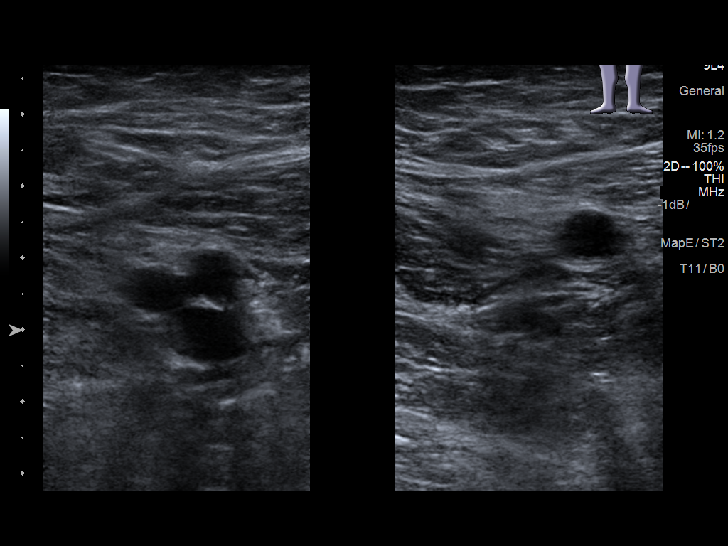
[im 43/59]
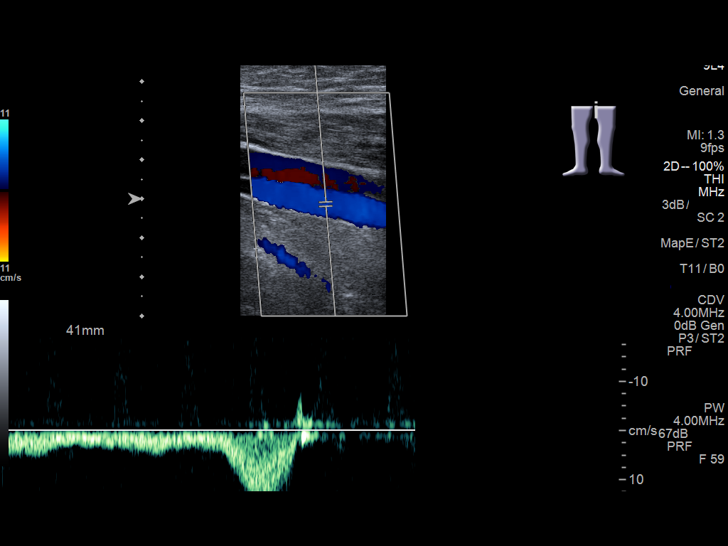
[im 48/59]
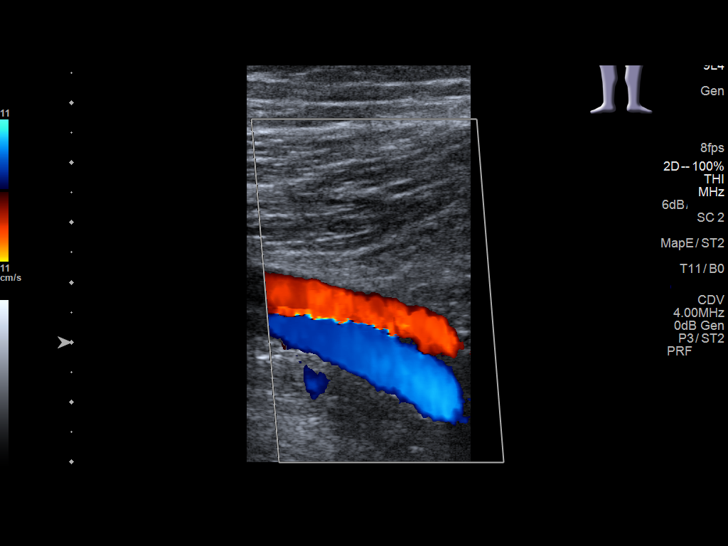
[im 53/59]
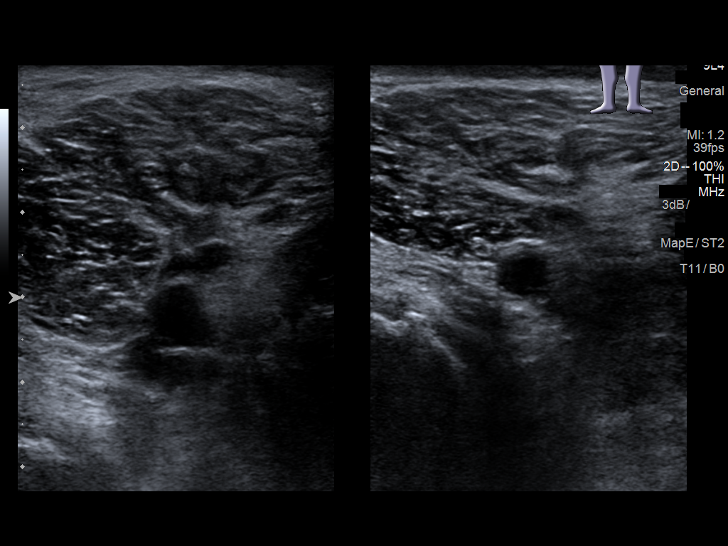
[im 59/59]
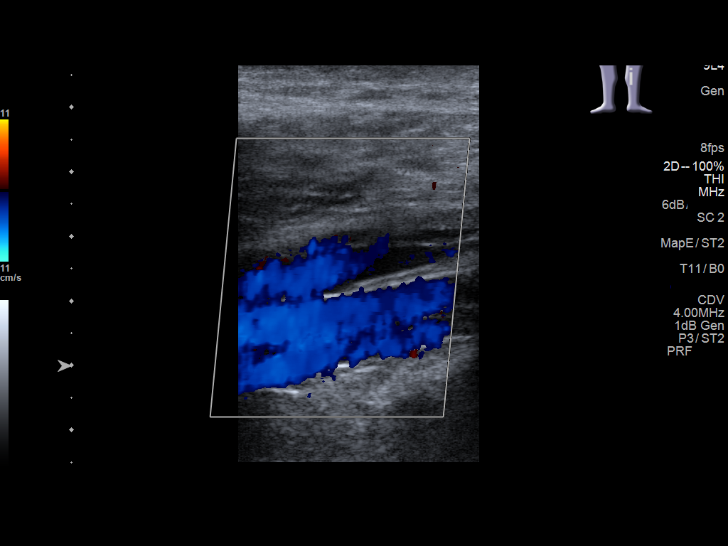

[13 of 24 positions shown; findings below may reference images not displayed]

FINDINGS: RIGHT LOWER EXTREMITY

Common Femoral Vein: No evidence of thrombus. Normal
compressibility, respiratory phasicity and response to augmentation.

Saphenofemoral Junction: No evidence of thrombus. Normal
compressibility and flow on color Doppler imaging.

Profunda Femoral Vein: No evidence of thrombus. Normal
compressibility and flow on color Doppler imaging.

Femoral Vein: No evidence of thrombus. Normal compressibility,
respiratory phasicity and response to augmentation.

Popliteal Vein: No evidence of thrombus. Normal compressibility,
respiratory phasicity and response to augmentation.

Calf Veins: No evidence of thrombus. Normal compressibility and flow
on color Doppler imaging.

Superficial Great Saphenous Vein: No evidence of thrombus. Normal
compressibility.

Venous Reflux:  Not assessed

Other Findings:  None.

LEFT LOWER EXTREMITY

Common Femoral Vein: No evidence of thrombus. Normal
compressibility, respiratory phasicity and response to augmentation.

Saphenofemoral Junction: No evidence of thrombus. Normal
compressibility and flow on color Doppler imaging.

Profunda Femoral Vein: No evidence of thrombus. Normal
compressibility and flow on color Doppler imaging.

Femoral Vein: No evidence of thrombus. Normal compressibility,
respiratory phasicity and response to augmentation.

Popliteal Vein: No evidence of thrombus. Normal compressibility,
respiratory phasicity and response to augmentation.

Calf Veins: No evidence of thrombus. Normal compressibility and flow
on color Doppler imaging.

Superficial Great Saphenous Vein: No evidence of thrombus. Normal
compressibility.

Venous Reflux:  Not assessed

Other Findings:  None.
IMPRESSION: No evidence of deep venous thrombosis in either lower extremity.

## 2019-10-26 ENCOUNTER — Telehealth: Payer: Self-pay | Admitting: Gastroenterology

## 2019-10-26 NOTE — Telephone Encounter (Signed)
Pt left vm he is scheduled for a colonoscopy for 11/13/19 and is checking if he was going to get a prescription for some medicine for the procedure please call pt

## 2019-10-27 ENCOUNTER — Telehealth: Payer: Self-pay

## 2019-10-27 NOTE — Telephone Encounter (Signed)
Returned patients call.  LVM for him asking him to call me back leaving a detailed message to let me know what pharmacy he would like for me to send his bowel prep rx into.  Informed him that the office is currently closed for the day, but when I return on Monday I will send rx to the pharmacy he has requested.  Thanks,  [place], [place]

## 2019-10-31 ENCOUNTER — Telehealth: Payer: Self-pay

## 2019-10-31 ENCOUNTER — Other Ambulatory Visit: Payer: Self-pay

## 2019-10-31 NOTE — Telephone Encounter (Signed)
Patient needs prep medication sent to [facility] in [place].

## 2019-11-01 ENCOUNTER — Other Ambulatory Visit: Payer: Self-pay

## 2019-11-01 MED ORDER — SUPREP BOWEL PREP KIT 17.5-3.13-1.6 GM/177ML PO SOLN: 1.0000 | ORAL | 0 refills | Status: AC

## 2019-11-01 NOTE — Telephone Encounter (Signed)
Prescription for suprep has been sent to [facility], [place].

## 2019-11-07 ENCOUNTER — Other Ambulatory Visit: Payer: Self-pay

## 2019-11-07 ENCOUNTER — Encounter: Payer: Self-pay | Admitting: Gastroenterology

## 2019-11-09 ENCOUNTER — Other Ambulatory Visit: Payer: Self-pay

## 2019-11-09 ENCOUNTER — Other Ambulatory Visit
Admission: RE | Admit: 2019-11-09 | Discharge: 2019-11-09 | Disposition: A | Discharge status: Home / Self Care | Payer: BC Managed Care – PPO | Source: Ambulatory Visit | Attending: Gastroenterology | Admitting: Gastroenterology

## 2019-11-09 DIAGNOSIS — Z20822 Contact with and (suspected) exposure to covid-19: Secondary | ICD-10-CM | POA: Diagnosis not present

## 2019-11-09 DIAGNOSIS — Z01812 Encounter for preprocedural laboratory examination: Secondary | ICD-10-CM | POA: Diagnosis present

## 2019-11-09 LAB — SARS CORONAVIRUS 2 (TAT 6-24 HRS): SARS Coronavirus 2: NEGATIVE

## 2019-11-10 NOTE — Discharge Instructions (Signed)
General Anesthesia, Adult, Care After This sheet gives you information about how to care for yourself after your procedure. Your health care provider may also give you more specific instructions. If you have problems or questions, contact your health care provider. What can I expect after the procedure? After the procedure, the following side effects are common:  Pain or discomfort at the IV site.  Nausea.  Vomiting.  Sore throat.  Trouble concentrating.  Feeling cold or chills.  Weak or tired.  Sleepiness and fatigue.  Soreness and body aches. These side effects can affect parts of the body that were not involved in surgery. Follow these instructions at home:  For at least 24 hours after the procedure:  Have a responsible adult stay with you. It is important to have someone help care for you until you are awake and alert.  Rest as needed.  Do not: ? Participate in activities in which you could fall or become injured. ? Drive. ? Use heavy machinery. ? Drink alcohol. ? Take sleeping pills or medicines that cause drowsiness. ? Make important decisions or sign legal documents. ? Take care of children on your own. Eating and drinking  Follow any instructions from your health care provider about eating or drinking restrictions.  When you feel hungry, start by eating small amounts of foods that are soft and easy to digest (bland), such as toast. Gradually return to your regular diet.  Drink enough fluid to keep your urine pale yellow.  If you vomit, rehydrate by drinking water, juice, or clear broth. General instructions  If you have sleep apnea, surgery and certain medicines can increase your risk for breathing problems. Follow instructions from your health care provider about wearing your sleep device: ? Anytime you are sleeping, including during daytime naps. ? While taking prescription pain medicines, sleeping medicines, or medicines that make you drowsy.  Return to  your normal activities as told by your health care provider. Ask your health care provider what activities are safe for you.  Take over-the-counter and prescription medicines only as told by your health care provider.  If you smoke, do not smoke without supervision.  Keep all follow-up visits as told by your health care provider. This is important. Contact a health care provider if:  You have nausea or vomiting that does not get better with medicine.  You cannot eat or drink without vomiting.  You have pain that does not get better with medicine.  You are unable to pass urine.  You develop a skin rash.  You have a fever.  You have redness around your IV site that gets worse. Get help right away if:  You have difficulty breathing.  You have chest pain.  You have blood in your urine or stool, or you vomit blood. Summary  After the procedure, it is common to have a sore throat or nausea. It is also common to feel tired.  Have a responsible adult stay with you for the first 24 hours after general anesthesia. It is important to have someone help care for you until you are awake and alert.  When you feel hungry, start by eating small amounts of foods that are soft and easy to digest (bland), such as toast. Gradually return to your regular diet.  Drink enough fluid to keep your urine pale yellow.  Return to your normal activities as told by your health care provider. Ask your health care provider what activities are safe for you. This information is not   intended to replace advice given to you by your health care provider. Make sure you discuss any questions you have with your health care provider. Document Revised: 07/23/2017 Document Reviewed: 03/05/2017 Elsevier Patient Education  2020 Elsevier Inc.  

## 2019-11-13 ENCOUNTER — Encounter: Payer: Self-pay | Admitting: Gastroenterology

## 2019-11-13 ENCOUNTER — Ambulatory Visit: Payer: BC Managed Care – PPO | Admitting: Anesthesiology

## 2019-11-13 ENCOUNTER — Ambulatory Visit
Admission: RE | Admit: 2019-11-13 | Discharge: 2019-11-13 | Disposition: A | Discharge status: Home / Self Care | Payer: BC Managed Care – PPO | Attending: Gastroenterology | Admitting: Gastroenterology

## 2019-11-13 ENCOUNTER — Other Ambulatory Visit: Payer: Self-pay

## 2019-11-13 ENCOUNTER — Encounter
Admission: RE | Disposition: A | Discharge status: Home / Self Care | Payer: Self-pay | Source: Home / Self Care | Attending: Gastroenterology

## 2019-11-13 DIAGNOSIS — Z8616 Personal history of COVID-19: Secondary | ICD-10-CM | POA: Insufficient documentation

## 2019-11-13 DIAGNOSIS — G4730 Sleep apnea, unspecified: Secondary | ICD-10-CM | POA: Diagnosis not present

## 2019-11-13 DIAGNOSIS — Z8546 Personal history of malignant neoplasm of prostate: Secondary | ICD-10-CM | POA: Insufficient documentation

## 2019-11-13 DIAGNOSIS — Z791 Long term (current) use of non-steroidal anti-inflammatories (NSAID): Secondary | ICD-10-CM | POA: Diagnosis not present

## 2019-11-13 DIAGNOSIS — Z91041 Radiographic dye allergy status: Secondary | ICD-10-CM | POA: Insufficient documentation

## 2019-11-13 DIAGNOSIS — Z860100 Personal history of colon polyps, unspecified: Secondary | ICD-10-CM

## 2019-11-13 DIAGNOSIS — Z87891 Personal history of nicotine dependence: Secondary | ICD-10-CM | POA: Diagnosis not present

## 2019-11-13 DIAGNOSIS — K640 First degree hemorrhoids: Secondary | ICD-10-CM | POA: Insufficient documentation

## 2019-11-13 DIAGNOSIS — Z1211 Encounter for screening for malignant neoplasm of colon: Secondary | ICD-10-CM | POA: Diagnosis not present

## 2019-11-13 DIAGNOSIS — Z96649 Presence of unspecified artificial hip joint: Secondary | ICD-10-CM | POA: Diagnosis not present

## 2019-11-13 DIAGNOSIS — Z79899 Other long term (current) drug therapy: Secondary | ICD-10-CM | POA: Insufficient documentation

## 2019-11-13 DIAGNOSIS — K635 Polyp of colon: Secondary | ICD-10-CM

## 2019-11-13 HISTORY — PX: COLONOSCOPY WITH PROPOFOL: SHX5780

## 2019-11-13 HISTORY — DX: Motion sickness, initial encounter: T75.3XXA

## 2019-11-13 HISTORY — DX: Sleep apnea, unspecified: G47.30

## 2019-11-13 HISTORY — PX: POLYPECTOMY: SHX5525

## 2019-11-13 SURGERY — COLONOSCOPY WITH PROPOFOL
Anesthesia: General | Site: Rectum

## 2019-11-13 MED ORDER — STERILE WATER FOR IRRIGATION IR SOLN
Status: DC | PRN
  Administered 2019-11-13: 50 mL

## 2019-11-13 MED ORDER — ACETAMINOPHEN 325 MG PO TABS: 325.0000 mg | ORAL_TABLET | Freq: Once | ORAL | Status: DC

## 2019-11-13 MED ORDER — LACTATED RINGERS IV SOLN: 10.0000 mL/h | INTRAVENOUS | Status: DC

## 2019-11-13 MED ORDER — ACETAMINOPHEN 160 MG/5ML PO SOLN: 325.0000 mg | Freq: Once | ORAL | Status: DC

## 2019-11-13 MED ORDER — SODIUM CHLORIDE 0.9 % IV SOLN: INTRAVENOUS | Status: DC

## 2019-11-13 MED ORDER — LACTATED RINGERS IV SOLN: INTRAVENOUS | Status: DC

## 2019-11-13 MED ORDER — LIDOCAINE HCL (CARDIAC) PF 100 MG/5ML IV SOSY
PREFILLED_SYRINGE | INTRAVENOUS | Status: DC | PRN
  Administered 2019-11-13: 20 mg via INTRAVENOUS

## 2019-11-13 MED ORDER — PROPOFOL 10 MG/ML IV BOLUS
INTRAVENOUS | Status: DC | PRN
  Administered 2019-11-13: 20 mg via INTRAVENOUS
  Administered 2019-11-13: 100 mg via INTRAVENOUS
  Administered 2019-11-13: 20 mg via INTRAVENOUS
  Administered 2019-11-13: 40 mg via INTRAVENOUS
  Administered 2019-11-13: 20 mg via INTRAVENOUS

## 2019-11-13 SURGICAL SUPPLY — 6 items
CANISTER SUCT 1200ML W/VALVE (MISCELLANEOUS) ×3 IMPLANT
FORCEPS BIOP RAD 4 LRG CAP 4 (CUTTING FORCEPS) ×2 IMPLANT
GOWN CVR UNV OPN BCK APRN NK (MISCELLANEOUS) ×2 IMPLANT
GOWN ISOL THUMB LOOP REG UNIV (MISCELLANEOUS) ×4
KIT ENDO PROCEDURE OLY (KITS) ×3 IMPLANT
WATER STERILE IRR 250ML POUR (IV SOLUTION) ×3 IMPLANT

## 2019-11-13 NOTE — Anesthesia Procedure Notes (Signed)
Procedure Name: MAC Date/Time: 11/13/2019 10:13 AM Performed by: Vanetta Shawl, CRNA Pre-anesthesia Checklist: Patient identified, Emergency Drugs available, Suction available, Timeout performed and Patient being monitored Patient Re-evaluated:Patient Re-evaluated prior to induction Oxygen Delivery Method: Nasal cannula Placement Confirmation: positive ETCO2

## 2019-11-13 NOTE — Transfer of Care (Signed)
Immediate Anesthesia Transfer of Care Note  Patient: Brendan Day  Procedure(s) Performed: COLONOSCOPY WITH BIOPSY (N/A Rectum) POLYPECTOMY (N/A Rectum)  Patient Location: PACU  Anesthesia Type: General  Level of Consciousness: awake, alert  and patient cooperative  Airway and Oxygen Therapy: Patient Spontanous Breathing and Patient connected to supplemental oxygen  Post-op Assessment: Post-op Vital signs reviewed, Patient's Cardiovascular Status Stable, Respiratory Function Stable, Patent Airway and No signs of Nausea or vomiting  Post-op Vital Signs: Reviewed and stable  Complications: No apparent anesthesia complications

## 2019-11-13 NOTE — Anesthesia Postprocedure Evaluation (Signed)
Anesthesia Post Note  Patient: Brendan Day  Procedure(s) Performed: COLONOSCOPY WITH BIOPSY (N/A Rectum) POLYPECTOMY (N/A Rectum)     Patient location during evaluation: PACU Anesthesia Type: General Level of consciousness: awake and alert and oriented Pain management: satisfactory to patient Vital Signs Assessment: post-procedure vital signs reviewed and stable Respiratory status: spontaneous breathing, nonlabored ventilation and respiratory function stable Cardiovascular status: blood pressure returned to baseline and stable Postop Assessment: Adequate PO intake and No signs of nausea or vomiting Anesthetic complications: no    Raliegh Ip

## 2019-11-13 NOTE — Anesthesia Preprocedure Evaluation (Signed)
Anesthesia Evaluation  Patient identified by MRN, date of birth, ID band Patient awake    Reviewed: Allergy & Precautions, H&P , NPO status , Patient's Chart, lab work & pertinent test results  Airway Mallampati: II  TM Distance: >3 FB Neck ROM: full    Dental no notable dental hx.    Pulmonary sleep apnea and Continuous Positive Airway Pressure Ventilation , former smoker,    Pulmonary exam normal breath sounds clear to auscultation       Cardiovascular Normal cardiovascular exam Rhythm:regular Rate:Normal     Neuro/Psych    GI/Hepatic   Endo/Other    Renal/GU      Musculoskeletal   Abdominal   Peds  Hematology   Anesthesia Other Findings   Reproductive/Obstetrics                             Anesthesia Physical Anesthesia Plan  ASA: II  Anesthesia Plan: General   Post-op Pain Management:    Induction: Intravenous  PONV Risk Score and Plan: 2 and Treatment may vary due to age or medical condition, TIVA and Propofol infusion  Airway Management Planned: Natural Airway  Additional Equipment:   Intra-op Plan:   Post-operative Plan:   Informed Consent: I have reviewed the patients History and Physical, chart, labs and discussed the procedure including the risks, benefits and alternatives for the proposed anesthesia with the patient or authorized representative who has indicated his/her understanding and acceptance.     Dental Advisory Given  Plan Discussed with: CRNA  Anesthesia Plan Comments:         Anesthesia Quick Evaluation

## 2019-11-13 NOTE — H&P (Signed)
Brendan Lame, MD [address] [place], [place] [address] Phone:[phone] Fax : [phone]  Primary Care Physician:  Sofie Hartigan, MD Primary Gastroenterologist:  Dr. Allen Norris  Pre-Procedure History & Physical: HPI:  Brendan Day is a 64 y.o. male is here for an colonoscopy.   Past Medical History:  Diagnosis Date  . COVID-19 06/2019  . Motion sickness    seeing motion on video screen  . Prostate cancer ([place]) 2009  . Sleep apnea    CPAP    Past Surgical History:  Procedure Laterality Date  . APPENDECTOMY  1996  . SHOULDER ADHESION RELEASE  2009  . TONSILLECTOMY  1978  . TOTAL HIP ARTHROPLASTY  2009  . TRANSURETHRAL RESECTION OF PROSTATE  2009    Prior to Admission medications   Medication Sig Start Date End Date Taking? Authorizing Provider  Glucosamine 750 MG TABS Take 1 tablet by mouth daily.   Yes [provider]  Na Sulfate-K Sulfate-Mg Sulf (SUPREP BOWEL PREP KIT) 17.5-3.13-1.6 GM/177ML SOLN Take 1 kit by mouth as directed. 11/01/19  Yes Brendan Lame, MD  naproxen (NAPROSYN) 500 MG tablet Take 1 tablet (500 mg total) by mouth 2 (two) times daily with a meal. For 2 weeks, then as needed 08/10/16  Yes Karamalegos, Devonne Doughty, DO  OVER THE COUNTER MEDICATION SARMS  Selective Androgen Receptor Modulators   Yes [provider]  traMADol (ULTRAM) 50 MG tablet Take 50 mg by mouth every 6 (six) hours as needed.   Yes [provider]  benzonatate (TESSALON) 100 MG capsule Take 1 capsule (100 mg total) by mouth 3 (three) times daily as needed for cough. Patient not taking: Reported on 11/07/2019 08/10/16   Olin Hauser, DO  sildenafil (REVATIO) 20 MG tablet Take 20 mg by mouth as needed. 10/19/14   [provider]    Allergies as of 08/10/2019 - Review Complete 10/18/2017  Allergen Reaction Noted  . Iodinated diagnostic agents Rash 03/21/2015    Family History  Problem Relation Age of Onset  . Cancer Father    . COPD Father   . Mental illness Mother     Social History   Socioeconomic History  . Marital status: Married    Spouse name: Not on file  . Number of children: Not on file  . Years of education: Not on file  . Highest education level: Not on file  Occupational History  . Not on file  Tobacco Use  . Smoking status: Former Smoker    Quit date: 2005    Years since quitting: 16.2  . Smokeless tobacco: Never Used  Substance and Sexual Activity  . Alcohol use: Yes    Alcohol/week: 1.0 standard drinks    Types: 1 Standard drinks or equivalent per week  . Drug use: No  . Sexual activity: Not on file  Other Topics Concern  . Not on file  Social History Narrative  . Not on file   Social Determinants of Health   Financial Resource Strain:   . Difficulty of Paying Living Expenses:   Food Insecurity:   . Worried About Charity fundraiser in the Last Year:   . Arboriculturist in the Last Year:   Transportation Needs:   . Film/video editor (Medical):   Marland Kitchen Lack of Transportation (Non-Medical):   Physical Activity:   . Days of Exercise per Week:   . Minutes of Exercise per Session:   Stress:   . Feeling  of Stress :   Social Connections:   . Frequency of Communication with Friends and Family:   . Frequency of Social Gatherings with Friends and Family:   . Attends Religious Services:   . Active Member of Clubs or Organizations:   . Attends Archivist Meetings:   Marland Kitchen Marital Status:   Intimate Partner Violence:   . Fear of Current or Ex-Partner:   . Emotionally Abused:   Marland Kitchen Physically Abused:   . Sexually Abused:     Review of Systems: See HPI, otherwise negative ROS  Physical Exam: BP 116/83   Pulse 84   Temp 97.7 F (36.5 C) (Temporal)   Resp 16   Ht '5\' 8"'  (1.727 m)   Wt 83.5 kg   SpO2 99%   BMI 27.98 kg/m  General:   Alert,  pleasant and cooperative in NAD Head:  Normocephalic and atraumatic. Neck:  Supple; no masses or thyromegaly. Lungs:   Clear throughout to auscultation.    Heart:  Regular rate and rhythm. Abdomen:  Soft, nontender and nondistended. Normal bowel sounds, without guarding, and without rebound.   Neurologic:  Alert and  oriented x4;  grossly normal neurologically.  Impression/Plan: Brendan Day is here for an colonoscopy to be performed for history of adenomatous polyps in 10/2014  Risks, benefits, limitations, and alternatives regarding  colonoscopy have been reviewed with the patient.  Questions have been answered.  All parties agreeable.   Brendan Lame, MD  11/13/2019, 9:39 AM

## 2019-11-13 NOTE — Op Note (Signed)
[facility] Patient Name: Brendan Day Procedure Date: 11/13/2019 10:05 AM MRN: [id number] Account #: [id number] Date of Birth: 1955-10-31 Admit Type: Outpatient Age: 64 Room: [facility] OR ROOM 01 Gender: Male Note Status: Finalized Procedure:             Colonoscopy Indications:           High risk colon cancer surveillance: Personal history                         of colonic polyps Providers:             Lucilla Lame MD, MD Referring MD:          Sofie Hartigan (Referring MD) Medicines:             Propofol per Anesthesia Complications:         No immediate complications. Procedure:             Pre-Anesthesia Assessment:                        - Prior to the procedure, a History and Physical was                         performed, and patient medications and allergies were                         reviewed. The patient's tolerance of previous                         anesthesia was also reviewed. The risks and benefits                         of the procedure and the sedation options and risks                         were discussed with the patient. All questions were                         answered, and informed consent was obtained. Prior                         Anticoagulants: The patient has taken no previous                         anticoagulant or antiplatelet agents. ASA Grade                         Assessment: II - A patient with mild systemic disease.                         After reviewing the risks and benefits, the patient                         was deemed in satisfactory condition to undergo the                         procedure.  After obtaining informed consent, the colonoscope was                         passed under direct vision. Throughout the procedure,                         the patient's blood pressure, pulse, and oxygen                         saturations were monitored continuously. The was                  introduced through the anus and advanced to the the                         cecum, identified by appendiceal orifice and ileocecal                         valve. The colonoscopy was performed without                         difficulty. The patient tolerated the procedure well.                         The quality of the bowel preparation was excellent. Findings:      The perianal and digital rectal examinations were normal.      Two sessile polyps were found in the descending colon. The polyps were 2       to 3 mm in size. These polyps were removed with a cold biopsy forceps.       Resection and retrieval were complete.      A 3 mm polyp was found in the sigmoid colon. The polyp was sessile. The       polyp was removed with a cold biopsy forceps. Resection and retrieval       were complete.      Non-bleeding internal hemorrhoids were found during retroflexion. The       hemorrhoids were Grade I (internal hemorrhoids that do not prolapse). Impression:            - Two 2 to 3 mm polyps in the descending colon,                         removed with a cold biopsy forceps. Resected and                         retrieved.                        - One 3 mm polyp in the sigmoid colon, removed with a                         cold biopsy forceps. Resected and retrieved.                        - Non-bleeding internal hemorrhoids. Recommendation:        - Discharge patient to home.                        - Resume previous diet.                        -  Continue present medications.                        - Await pathology results.                        - Repeat colonoscopy in 5 years for surveillance. Procedure Code(s):     --- Professional ---                        [phone], Colonoscopy, flexible; with biopsy, single or                         multiple Diagnosis Code(s):     --- Professional ---                        Z86.010, Personal history of colonic polyps                         K63.5, Polyp of colon CPT copyright 2019 American Medical Association. All rights reserved. The codes documented in this report are preliminary and upon coder review may  be revised to meet current compliance requirements. Lucilla Lame MD, MD 11/13/2019 10:28:38 AM This report has been signed electronically. Number of Addenda: 0 Note Initiated On: 11/13/2019 10:05 AM Scope Withdrawal Time: 0 hours 6 minutes 49 seconds  Total Procedure Duration: 0 hours 9 minutes 23 seconds  Estimated Blood Loss:  Estimated blood loss: none.      [facility]

## 2019-11-14 ENCOUNTER — Encounter: Payer: Self-pay | Admitting: *Deleted

## 2019-11-15 ENCOUNTER — Encounter: Payer: Self-pay | Admitting: Gastroenterology

## 2019-11-15 LAB — SURGICAL PATHOLOGY

## 2023-04-21 ENCOUNTER — Telehealth: Payer: Self-pay

## 2023-04-21 NOTE — Telephone Encounter (Signed)
Request for medical records request were sent from [facility]  to [facility] medical records on 04/21/2023
